# Patient Record
Sex: Male | Born: 1985 | Race: Black or African American | Hispanic: No | Marital: Single | Smoking: Current every day smoker
Health system: Southern US, Community
[De-identification: ages and names within clinical notes are randomized; demographics above are authoritative.]

---
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
## 2008-02-25 ENCOUNTER — Emergency Department (HOSPITAL_COMMUNITY): Admission: EM | Admit: 2008-02-25 | Discharge: 2008-02-26 | Payer: Self-pay | Admitting: Emergency Medicine

## 2008-10-28 ENCOUNTER — Emergency Department (HOSPITAL_COMMUNITY): Admission: EM | Admit: 2008-10-28 | Discharge: 2008-10-28 | Payer: Self-pay | Admitting: Emergency Medicine

## 2009-03-02 ENCOUNTER — Emergency Department (HOSPITAL_COMMUNITY): Admission: EM | Admit: 2009-03-02 | Discharge: 2009-03-03 | Payer: Self-pay | Admitting: Emergency Medicine

## 2011-03-12 ENCOUNTER — Emergency Department (HOSPITAL_COMMUNITY)
Admission: EM | Admit: 2011-03-12 | Discharge: 2011-03-13 | Disposition: A | Discharge status: Home / Self Care | Payer: Self-pay | Attending: Emergency Medicine | Admitting: Emergency Medicine

## 2011-03-12 DIAGNOSIS — M25539 Pain in unspecified wrist: Secondary | ICD-10-CM | POA: Insufficient documentation

## 2011-03-12 DIAGNOSIS — M25439 Effusion, unspecified wrist: Secondary | ICD-10-CM | POA: Insufficient documentation

## 2011-03-12 DIAGNOSIS — S6990XA Unspecified injury of unspecified wrist, hand and finger(s), initial encounter: Secondary | ICD-10-CM | POA: Insufficient documentation

## 2011-03-12 DIAGNOSIS — S62009A Unspecified fracture of navicular [scaphoid] bone of unspecified wrist, initial encounter for closed fracture: Secondary | ICD-10-CM | POA: Insufficient documentation

## 2011-03-12 DIAGNOSIS — M7989 Other specified soft tissue disorders: Secondary | ICD-10-CM | POA: Insufficient documentation

## 2011-03-12 DIAGNOSIS — M79609 Pain in unspecified limb: Secondary | ICD-10-CM | POA: Insufficient documentation

## 2011-03-13 ENCOUNTER — Emergency Department (HOSPITAL_COMMUNITY): Payer: Self-pay

## 2011-12-27 ENCOUNTER — Encounter (HOSPITAL_COMMUNITY): Payer: Self-pay | Admitting: *Deleted

## 2011-12-27 ENCOUNTER — Emergency Department (HOSPITAL_COMMUNITY)
Admission: EM | Admit: 2011-12-27 | Discharge: 2011-12-27 | Disposition: A | Discharge status: Home / Self Care | Payer: Self-pay | Attending: Emergency Medicine | Admitting: Emergency Medicine

## 2011-12-27 DIAGNOSIS — R21 Rash and other nonspecific skin eruption: Secondary | ICD-10-CM

## 2011-12-27 DIAGNOSIS — L299 Pruritus, unspecified: Secondary | ICD-10-CM | POA: Insufficient documentation

## 2011-12-27 MED ORDER — CEPHALEXIN 500 MG PO CAPS: 500.0000 mg | ORAL_CAPSULE | Freq: Four times a day (QID) | ORAL | Status: AC

## 2011-12-27 MED ORDER — PREDNISONE 10 MG PO TABS: 20.0000 mg | ORAL_TABLET | Freq: Every day | ORAL | Status: DC

## 2011-12-27 MED ORDER — DIPHENHYDRAMINE HCL 25 MG PO CAPS: 25.0000 mg | ORAL_CAPSULE | Freq: Four times a day (QID) | ORAL | Status: DC | PRN

## 2011-12-27 NOTE — ED Notes (Signed)
Pt states he started to have a rash about 4 days ago. Rash is on pt's back, arms, and face. Pt denies any sob with rash. Pt state rash itches and he is unsure of how he got the rash

## 2011-12-27 NOTE — ED Provider Notes (Signed)
History     CSN: [id number]  Arrival date & time 12/27/11  1503   First MD Initiated Contact with Patient 12/27/11 1625      Chief Complaint  Patient presents with  . Rash    (Consider location/radiation/quality/duration/timing/severity/associated sxs/prior treatment) HPI  26 year old male presents for evaluation of the rash. Patient states he started noticing a rash about 4 days ago. States he noticed the rash initially started on his forehead and now has spread throughout his body.  The rash is most noticeable to his face, arms, chest, and back. Rash is itchy, but nontender. Has not noticed any rash on his hands or on his feet.  Denies fever, headache, throat swelling, nausea, vomiting, or abnormal bleeding. Does report that he spend the night over his mom's house prior to having this rash denies anyone else at home with similar rash. Has had chickenpox in the past. Denies new detergent, new pets, or change in medication. Denies any recent travel.    History reviewed. No pertinent past medical history.  History reviewed. No pertinent past surgical history.  No family history on file.  History  Substance Use Topics  . Smoking status: Not on file  . Smokeless tobacco: Not on file  . Alcohol Use: Not on file      Review of Systems  All other systems reviewed and are negative.    Allergies  Review of patient's allergies indicates no known allergies.  Home Medications  No current outpatient prescriptions on file.  There were no vitals taken for this visit.  Physical Exam  Nursing note and vitals reviewed. Constitutional: He is oriented to person, place, and time. He appears well-developed and well-nourished. No distress.  HENT:  Head: Atraumatic.  Mouth/Throat: Oropharynx is clear and moist. No oropharyngeal exudate.  Eyes: Conjunctivae are normal. Right eye exhibits no discharge. Left eye exhibits no discharge. No scleral icterus.  Neck: Neck supple.    Pulmonary/Chest: Effort normal. No respiratory distress. He has no wheezes.  Abdominal: Soft.  Musculoskeletal: Normal range of motion. He exhibits no edema.  Neurological: He is alert and oriented to person, place, and time.  Skin: Skin is warm. Rash noted.       Maculopapular rash evenly distributed  on forehead, bilateral arms, abdomen, and back. Nonpustular, non-petechial, non-vesicular. No rash in palms of hands, or soles of feet.  No oral mucosal involvement.      ED Course  Procedures (including critical care time)  Labs Reviewed - No data to display No results found.   No diagnosis found.    MDM  Maculopapular itchy rash, without other acute finding.  Doubt scabies.  Doubt life threatening rash or allergic reaction.  Plan to d/c with prednisone, benadryl, and keflex to prevent secondary skin infection.  Pt instruct to cut fingernail short and avoid scratching.  Dermatology referral given.  Wife in the room, who denies having similar rash.         Fayrene Helper, PA-C 12/27/11 1641

## 2011-12-27 NOTE — ED Provider Notes (Signed)
Medical screening examination/treatment/procedure(s) were performed by non-physician practitioner and as supervising physician I was immediately available for consultation/collaboration.  Devoria Albe, MD, FACEP   Ward Givens, MD 12/27/11 [phone]

## 2012-01-01 ENCOUNTER — Emergency Department (HOSPITAL_COMMUNITY)
Admission: EM | Admit: 2012-01-01 | Discharge: 2012-01-01 | Disposition: A | Discharge status: Home / Self Care | Payer: Self-pay | Attending: Emergency Medicine | Admitting: Emergency Medicine

## 2012-01-01 ENCOUNTER — Encounter (HOSPITAL_COMMUNITY): Payer: Self-pay | Admitting: Emergency Medicine

## 2012-01-01 DIAGNOSIS — F17200 Nicotine dependence, unspecified, uncomplicated: Secondary | ICD-10-CM | POA: Insufficient documentation

## 2012-01-01 DIAGNOSIS — R21 Rash and other nonspecific skin eruption: Secondary | ICD-10-CM | POA: Insufficient documentation

## 2012-01-01 MED ORDER — PREDNISONE 20 MG PO TABS: 60.0000 mg | ORAL_TABLET | Freq: Every day | ORAL | Status: DC

## 2012-01-01 MED ORDER — PERMETHRIN 5 % EX CREA: TOPICAL_CREAM | CUTANEOUS | Status: AC

## 2012-01-01 MED ORDER — HYDROXYZINE HCL 25 MG PO TABS: 25.0000 mg | ORAL_TABLET | Freq: Four times a day (QID) | ORAL | Status: AC

## 2012-01-01 MED ORDER — DEXAMETHASONE SODIUM PHOSPHATE 10 MG/ML IJ SOLN
10.0000 mg | Freq: Once | INTRAMUSCULAR | Status: AC
  Administered 2012-01-01: 10 mg via INTRAVENOUS
  Filled 2012-01-01: qty 1

## 2012-01-01 MED ORDER — DIPHENHYDRAMINE HCL 50 MG/ML IJ SOLN
25.0000 mg | Freq: Once | INTRAMUSCULAR | Status: AC
  Administered 2012-01-01: 50 mg via INTRAMUSCULAR
  Filled 2012-01-01: qty 1

## 2012-01-01 MED ORDER — HYDROCORTISONE 2.5 % EX LOTN: TOPICAL_LOTION | Freq: Two times a day (BID) | CUTANEOUS | Status: DC

## 2012-01-01 NOTE — ED Provider Notes (Signed)
Medical screening examination/treatment/procedure(s) were conducted as a shared visit with non-physician practitioner(s) and myself.  I personally evaluated the patient during the encounter   Rolan Bucco, MD 01/01/12 1526

## 2012-01-01 NOTE — ED Notes (Signed)
Pt seen here for skin rash on 12/27/11 and is back due to rash becoming worse and spreading. Pt reports itching with no relief. Pt called dermotologist but "they told me they could not see me until January."

## 2012-01-01 NOTE — ED Provider Notes (Signed)
History     CSN: [id number]  Arrival date & time 01/01/12  1243   First MD Initiated Contact with Patient 01/01/12 1336      No chief complaint on file.   (Consider location/radiation/quality/duration/timing/severity/associated sxs/prior treatment) Patient is a 26 y.o. male presenting with rash. The history is provided by the patient and the spouse.  Rash  The current episode started more than 1 week ago. The problem has been gradually worsening. There has been no fever. Pertinent negatives include no pain and no weeping.   26 y/o male INAD c/o pruritic rash that started on 7/8 on the patients face. Pt was seen for similar on 7/10 and given keflex with prednisone 40mg  PO daily with no relief. Rash is spreading to arm then torsoand now to thighs and  Buttocks. Denies fever, similar sick contacts, or change in environmental exposures.   History reviewed. No pertinent past medical history.  History reviewed. No pertinent past surgical history.  No family history on file.  History  Substance Use Topics  . Smoking status: Current Everyday Smoker  . Smokeless tobacco: Not on file  . Alcohol Use: No      Review of Systems  Constitutional: Negative for fever.  Skin: Positive for rash.  All other systems reviewed and are negative.    Allergies  Review of patient's allergies indicates no known allergies.  Home Medications   Current Outpatient Rx  Name Route Sig Dispense Refill  . CEPHALEXIN 500 MG PO CAPS Oral Take 1 capsule (500 mg total) by mouth 4 (four) times daily. 28 capsule 0  . PREDNISONE 10 MG PO TABS Oral Take 2 tablets (20 mg total) by mouth daily. 15 tablet 0    BP 123/61  Pulse 59  Temp 98.3 F (36.8 C) (Oral)  Resp 18  SpO2 100%  Physical Exam  Vitals reviewed. Constitutional: He is oriented to person, place, and time. He appears well-developed and well-nourished. No distress.  HENT:  Head: Normocephalic.  Eyes: Conjunctivae and EOM are normal.    Cardiovascular: Normal rate.   Pulmonary/Chest: Effort normal.  Musculoskeletal: Normal range of motion.  Neurological: He is alert and oriented to person, place, and time.  Skin:       Maculopapular excoriated rash to face, arms, torso and upper thighs. No induration, warmth, weeping or pain. Web spaces spared.   Psychiatric: He has a normal mood and affect.    ED Course  Procedures (including critical care time)  Labs Reviewed - No data to display No results found.   No diagnosis found.  Rash  MDM  Puritic rash not consistent with fungal or bacterial infection, will elevate treatment with Decadron and benadryl injection. Will Rx with 60mg  prednisone taper and atarax. Will treat for scabies, though clinically unlikely. Will refer to Dr. Terri Piedra who generally has shorter wait times. Shared visit with Dr. Fredderick Phenix.  Pt verbalized understanding and agrees with care plan. Outpatient follow-up and return precautions given.          Joni Reining Daking Westervelt, PA-C 01/01/12 1440

## 2012-06-14 ENCOUNTER — Emergency Department (HOSPITAL_COMMUNITY)
Admission: EM | Admit: 2012-06-14 | Discharge: 2012-06-15 | Disposition: A | Discharge status: Home / Self Care | Payer: Self-pay | Attending: Emergency Medicine | Admitting: Emergency Medicine

## 2012-06-14 ENCOUNTER — Encounter (HOSPITAL_COMMUNITY): Payer: Self-pay

## 2012-06-14 ENCOUNTER — Emergency Department (HOSPITAL_COMMUNITY): Payer: Self-pay

## 2012-06-14 DIAGNOSIS — M25559 Pain in unspecified hip: Secondary | ICD-10-CM | POA: Insufficient documentation

## 2012-06-14 DIAGNOSIS — M549 Dorsalgia, unspecified: Secondary | ICD-10-CM | POA: Insufficient documentation

## 2012-06-14 DIAGNOSIS — S32009A Unspecified fracture of unspecified lumbar vertebra, initial encounter for closed fracture: Secondary | ICD-10-CM | POA: Insufficient documentation

## 2012-06-14 DIAGNOSIS — R109 Unspecified abdominal pain: Secondary | ICD-10-CM | POA: Insufficient documentation

## 2012-06-14 DIAGNOSIS — F17200 Nicotine dependence, unspecified, uncomplicated: Secondary | ICD-10-CM | POA: Insufficient documentation

## 2012-06-14 DIAGNOSIS — Y92410 Unspecified street and highway as the place of occurrence of the external cause: Secondary | ICD-10-CM | POA: Insufficient documentation

## 2012-06-14 DIAGNOSIS — Y9389 Activity, other specified: Secondary | ICD-10-CM | POA: Insufficient documentation

## 2012-06-14 LAB — URINALYSIS, ROUTINE W REFLEX MICROSCOPIC
Glucose, UA: NEGATIVE mg/dL
Hgb urine dipstick: NEGATIVE
Ketones, ur: NEGATIVE mg/dL
Leukocytes, UA: NEGATIVE
pH: 5.5 (ref 5.0–8.0)

## 2012-06-14 MED ORDER — METHOCARBAMOL 500 MG PO TABS: 1000.0000 mg | ORAL_TABLET | Freq: Four times a day (QID) | ORAL | Status: AC

## 2012-06-14 MED ORDER — OXYCODONE-ACETAMINOPHEN 5-325 MG PO TABS
1.0000 | ORAL_TABLET | Freq: Once | ORAL | Status: AC
  Administered 2012-06-14: 1 via ORAL
  Filled 2012-06-14: qty 1

## 2012-06-14 MED ORDER — OXYCODONE-ACETAMINOPHEN 5-325 MG PO TABS: 1.0000 | ORAL_TABLET | Freq: Four times a day (QID) | ORAL | Status: AC | PRN

## 2012-06-14 MED ORDER — HYDROCODONE-ACETAMINOPHEN 5-325 MG PO TABS
1.0000 | ORAL_TABLET | Freq: Once | ORAL | Status: AC
  Administered 2012-06-14: 1 via ORAL
  Filled 2012-06-14: qty 1

## 2012-06-14 MED ORDER — NAPROXEN 500 MG PO TABS: 500.0000 mg | ORAL_TABLET | Freq: Two times a day (BID) | ORAL | Status: AC
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
## 2012-06-14 NOTE — ED Notes (Signed)
Returned from CT.

## 2012-06-14 NOTE — ED Notes (Signed)
Patient transported to X-ray 
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
## 2012-06-15 NOTE — ED Provider Notes (Signed)
Medical screening examination/treatment/procedure(s) were conducted as a shared visit with non-physician practitioner(s) and myself.  I personally evaluated the patient during the encounter    Nelia Shi, MD 06/15/12 [phone]

## 2014-06-27 IMAGING — CT CT L SPINE W/O CM
2 series · 10 of 14 positions shown, 12 images · non-contrast
Comparison: Lumbar spine radiographs performed earlier today at

CLINICAL DATA: Evaluate fractures of the transverse processes of
the lumbar spine.

CT LUMBAR SPINE WITHOUT CONTRAST
TECHNIQUE: Multidetector CT imaging of the lumbar spine was
performed without intravenous contrast administration. Multiplanar
CT image reconstructions were also generated.

[Series 2: l-spine · axial · 0.28mm/px · z∈[-224,-56]mm · 7 of 113 slices shown, 9 images]
[im 15/113  soft-tissue]
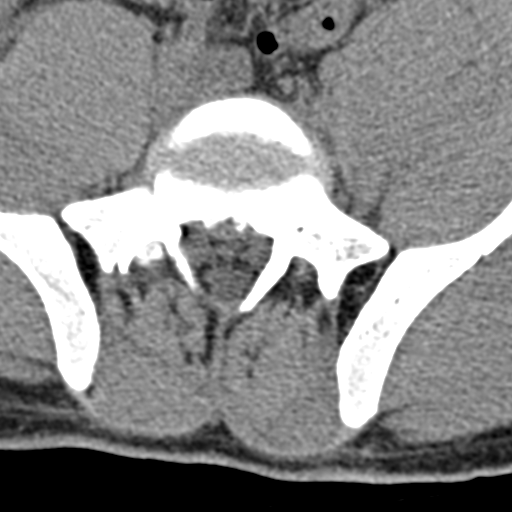
[im 15/113  bone]
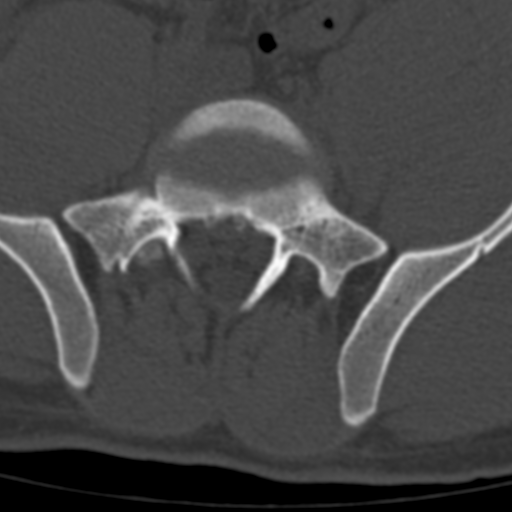
[im 29/113  bone]
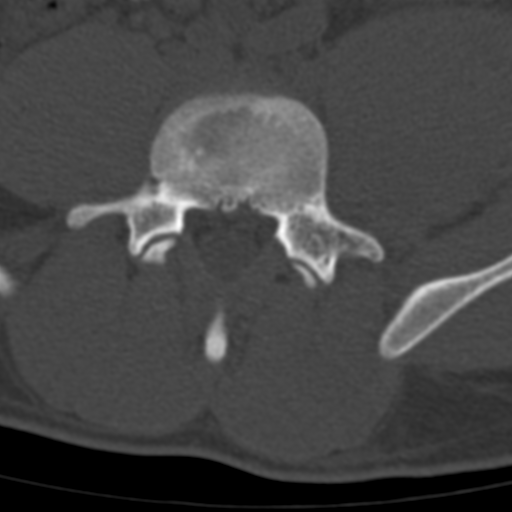
[im 43/113  bone]
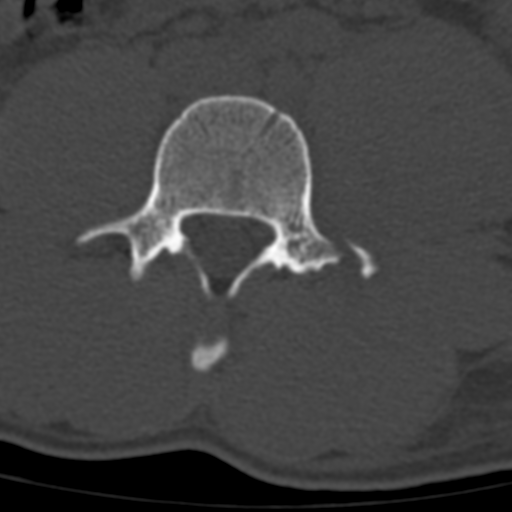
[im 57/113  bone]
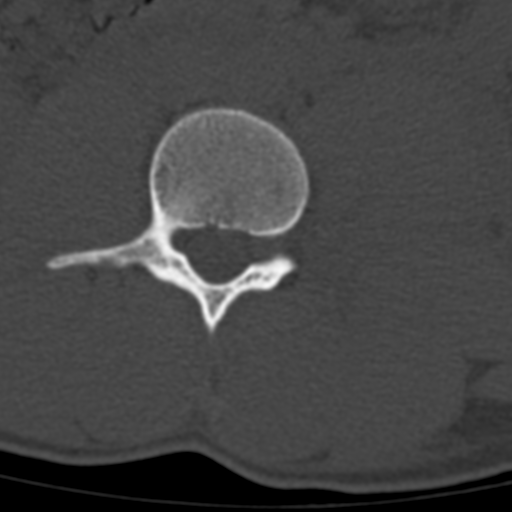
[im 71/113  soft-tissue]
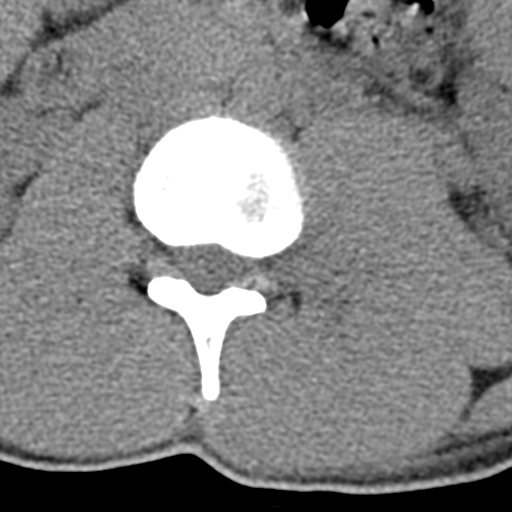
[im 71/113  bone]
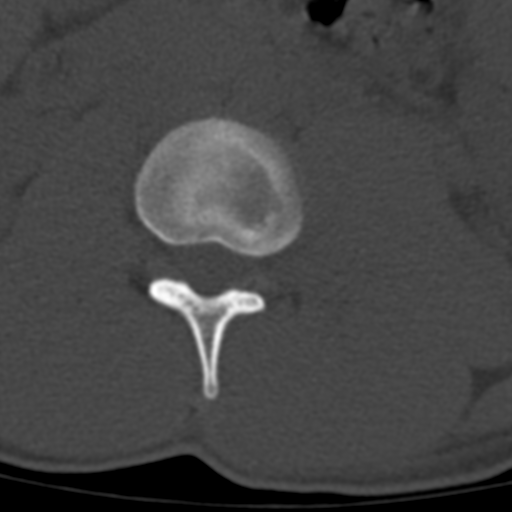
[im 85/113  bone]
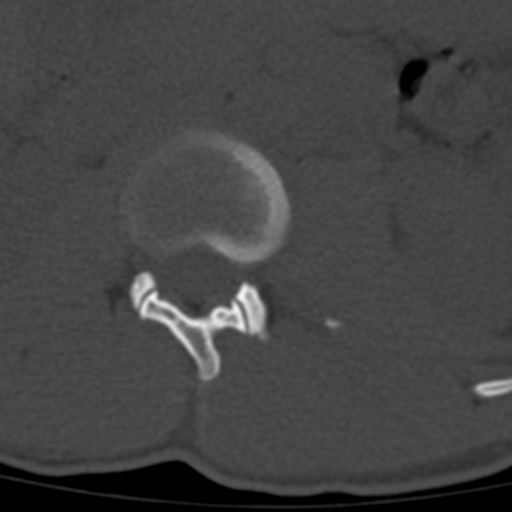
[im 99/113  bone]
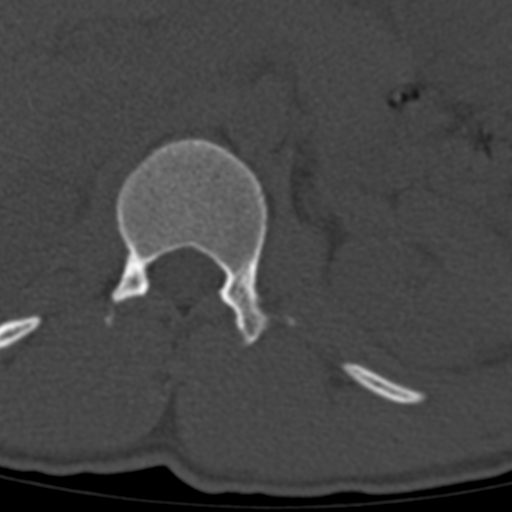

[Series 602: axial · axial · 0.32mm/px · z∈[-127,-58]mm · 3 of 71 slices shown]
[im 18/71  bone]
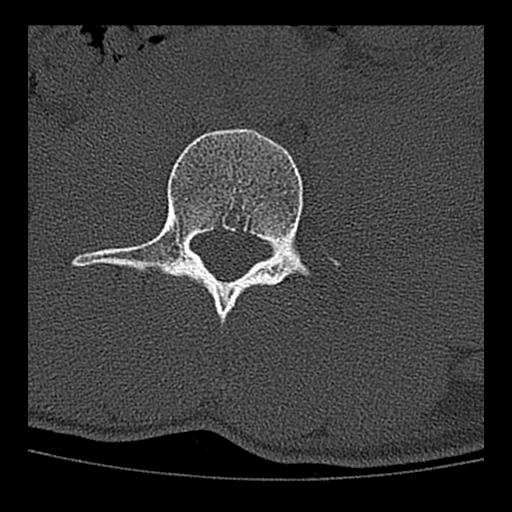
[im 36/71  bone]
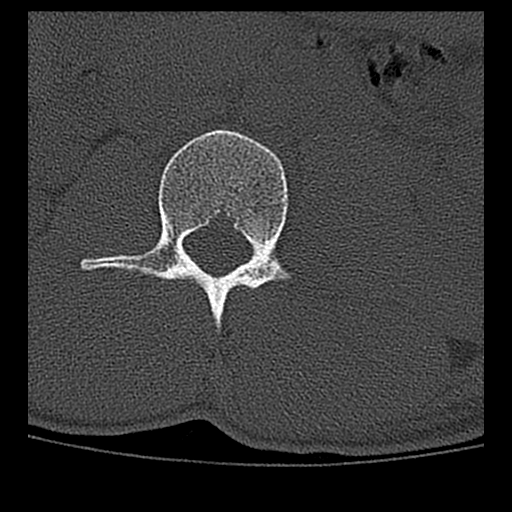
[im 53/71  bone]
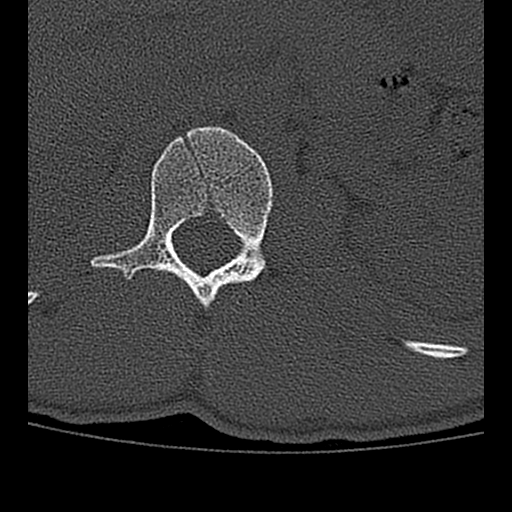

[10 of 14 positions shown; findings below may reference images not displayed]

FINDINGS: As noted on lumbar spine radiographs, there are displaced
fractures of the left transverse processes of L2, L3 and L4.  No
additional fractures are seen.  The vertebral bodies demonstrate
normal height and alignment.  Intervertebral disc spaces are
preserved.  The bony foramina are grossly unremarkable in
appearance.

No significant disc protrusions are characterized on CT.  Minimal
multilevel disc bulging remains at the upper limits of normal.

There is suggestion of a small amount of likely retroperitoneal
blood tracking anterior to the left psoas muscle; suggest clinical
correlation for hemodynamic stability.
IMPRESSION: 1.  Displaced fractures of the left transverse processes of L2, L3
and L4.  No additional fractures seen.
2.  Suggestion of a small amount of likely retroperitoneal blood
tracking anterior to the left psoas muscle; suggest clinical
correlation for hemodynamic stability.

to Tarantini D Alessandro, who verbally acknowledged these results.

## 2015-03-29 ENCOUNTER — Encounter (HOSPITAL_COMMUNITY): Payer: Self-pay

## 2015-03-29 ENCOUNTER — Emergency Department (HOSPITAL_COMMUNITY)
Admission: EM | Admit: 2015-03-29 | Discharge: 2015-03-29 | Disposition: A | Discharge status: Home / Self Care | Payer: Self-pay | Attending: Emergency Medicine | Admitting: Emergency Medicine

## 2015-03-29 DIAGNOSIS — S01111A Laceration without foreign body of right eyelid and periocular area, initial encounter: Secondary | ICD-10-CM | POA: Insufficient documentation

## 2015-03-29 DIAGNOSIS — Y92149 Unspecified place in prison as the place of occurrence of the external cause: Secondary | ICD-10-CM | POA: Insufficient documentation

## 2015-03-29 DIAGNOSIS — Z23 Encounter for immunization: Secondary | ICD-10-CM | POA: Insufficient documentation

## 2015-03-29 DIAGNOSIS — W2209XA Striking against other stationary object, initial encounter: Secondary | ICD-10-CM | POA: Insufficient documentation

## 2015-03-29 DIAGNOSIS — Z720 Tobacco use: Secondary | ICD-10-CM | POA: Insufficient documentation

## 2015-03-29 DIAGNOSIS — Y998 Other external cause status: Secondary | ICD-10-CM | POA: Insufficient documentation

## 2015-03-29 DIAGNOSIS — Z791 Long term (current) use of non-steroidal anti-inflammatories (NSAID): Secondary | ICD-10-CM | POA: Insufficient documentation

## 2015-03-29 DIAGNOSIS — Y9389 Activity, other specified: Secondary | ICD-10-CM | POA: Insufficient documentation

## 2015-03-29 DIAGNOSIS — Z79899 Other long term (current) drug therapy: Secondary | ICD-10-CM | POA: Insufficient documentation

## 2015-03-29 DIAGNOSIS — S0191XA Laceration without foreign body of unspecified part of head, initial encounter: Secondary | ICD-10-CM

## 2015-03-29 MED ORDER — TETANUS-DIPHTH-ACELL PERTUSSIS 5-2.5-18.5 LF-MCG/0.5 IM SUSP
0.5000 mL | Freq: Once | INTRAMUSCULAR | Status: AC
  Administered 2015-03-29: 0.5 mL via INTRAMUSCULAR
  Filled 2015-03-29: qty 0.5

## 2015-03-29 MED ORDER — LIDOCAINE HCL (PF) 1 % IJ SOLN
5.0000 mL | Freq: Once | INTRAMUSCULAR | Status: AC
  Administered 2015-03-29: 5 mL
  Filled 2015-03-29: qty 5

## 2015-03-29 NOTE — Discharge Instructions (Signed)
1. Medications: usual home medications 2. Treatment: rest, drink plenty of fluids, keep dressing clean and dry, change dressing daily 3. Follow Up: please followup in 7 days for suture removal; please return to the ER for severe pain, persistent bleeding, signs of infection (redness, heat, discharge), new or worsening symptoms   Head Injury, Adult You have a head injury. Headaches and throwing up (vomiting) are common after a head injury. It should be easy to wake up from sleeping. Sometimes you must stay in the hospital. Most problems happen within the first 24 hours. Side effects may occur up to 7-10 days after the injury.  WHAT ARE THE TYPES OF HEAD INJURIES? Head injuries can be as minor as a bump. Some head injuries can be more severe. More severe head injuries include:  A jarring injury to the brain (concussion).  A bruise of the brain (contusion). This mean there is bleeding in the brain that can cause swelling.  A cracked skull (skull fracture).  Bleeding in the brain that collects, clots, and forms a bump (hematoma). WHEN SHOULD I GET HELP RIGHT AWAY?   You are confused or sleepy.  You cannot be woken up.  You feel sick to your stomach (nauseous) or keep throwing up (vomiting).  Your dizziness or unsteadiness is getting worse.  You have very bad, lasting headaches that are not helped by medicine. Take medicines only as told by your doctor.  You cannot use your arms or legs like normal.  You cannot walk.  You notice changes in the black spots in the center of the colored part of your eye (pupil).  You have clear or bloody fluid coming from your nose or ears.  You have trouble seeing. During the next 24 hours after the injury, you must stay with someone who can watch you. This person should get help right away (call 911 in the [place] if you start to shake and are not able to control it (have seizures), you pass out, or you are unable to wake up. HOW CAN I PREVENT A HEAD  INJURY IN THE FUTURE?  Wear seat belts.  Wear a helmet while bike riding and playing sports like football.  Stay away from dangerous activities around the house. WHEN CAN I RETURN TO NORMAL ACTIVITIES AND ATHLETICS? See your doctor before doing these activities. You should not do normal activities or play contact sports until 1 week after the following symptoms have stopped:  Headache that does not go away.  Dizziness.  Poor attention.  Confusion.  Memory problems.  Sickness to your stomach or throwing up.  Tiredness.  Fussiness.  Bothered by bright lights or loud noises.  Anxiousness or depression.  Restless sleep. MAKE SURE YOU:   Understand these instructions.  Will watch your condition.  Will get help right away if you are not doing well or get worse.   This information is not intended to replace advice given to you by your health care provider. Make sure you discuss any questions you have with your health care provider.   Document Released: 05/18/2008 Document Revised: 06/26/2014 Document Reviewed: 02/10/2013 Elsevier Interactive Patient Education 2016 [employer]  Sutured Wound Care Sutures are stitches that can be used to close wounds. Taking care of your wound properly can help to prevent pain and infection. It can also help your wound to heal more quickly. HOW TO CARE FOR YOUR SUTURED WOUND Wound Care  Keep the wound clean and dry.  If you were given a bandage (  dressing), you should change it at least once per day or as directed by your health care provider. You should also change it if it becomes wet or dirty.  Keep the wound completely dry for the first 24 hours or as directed by your health care provider. After that time, you may shower or bathe. However, make sure that the wound is not soaked in water until the sutures have been removed.  Clean the wound one time each day or as directed by your health care provider.  Wash the wound with soap  and water.  Rinse the wound with water to remove all soap.  Pat the wound dry with a clean towel. Do not rub the wound.  Aftercleaning the wound, apply a thin layer of antibioticointment as directed by your health care provider. This will help to prevent infection and keep the dressing from sticking to the wound.  Have the sutures removed as directed by your health care provider. General Instructions  Take or apply medicines only as directed by your health care provider.  To help prevent scarring, make sure to cover your wound with sunscreen whenever you are outside after the sutures are removed and the wound is healed. Make sure to wear a sunscreen of at least 30 SPF.  If you were prescribed an antibiotic medicine or ointment, finish all of it even if you start to feel better.  Do not scratch or pick at the wound.  Keep all follow-up visits as directed by your health care provider. This is important.  Check your wound every day for signs of infection. Watch for:   Redness, swelling, or pain.  Fluid, blood, or pus.  Raise (elevate) the injured area above the level of your heart while you are sitting or lying down, if possible.  Avoid stretching your wound.  Drink enough fluids to keep your urine clear or pale yellow. SEEK MEDICAL CARE IF:  You received a tetanus shot and you have swelling, severe pain, redness, or bleeding at the injection site.  You have a fever.  A wound that was closed breaks open.  You notice a bad smell coming from the wound.  You notice something coming out of the wound, such as wood or glass.  Your pain is not controlled with medicine.  You have increased redness, swelling, or pain at the site of your wound.  You have fluid, blood, or pus coming from your wound.  You notice a change in the color of your skin near your wound.  You need to change the dressing frequently due to fluid, blood, or pus draining from the wound.  You develop a  new rash.  You develop numbness around the wound. SEEK IMMEDIATE MEDICAL CARE IF:  You develop severe swelling around the injury site.  Your pain suddenly increases and is severe.  You develop painful lumps near the wound or on skin that is anywhere on your body.  You have a red streak going away from your wound.  The wound is on your hand or foot and you cannot properly move a finger or toe.  The wound is on your hand or foot and you notice that your fingers or toes look pale or bluish.   This information is not intended to replace advice given to you by your health care provider. Make sure you discuss any questions you have with your health care provider.   Document Released: 07/13/2004 Document Revised: 10/20/2014 Document Reviewed: 01/15/2013 Elsevier Interactive Patient Education 2016  [employer].

## 2015-03-29 NOTE — ED Notes (Signed)
Pt hit his head on a table in jail and has about a 2 inch laceration above his right eye, bleeding controlled, he also has an abrasion and hematoma on the left side of his forehead

## 2015-03-29 NOTE — ED Provider Notes (Signed)
CSN: [id number]     Arrival date & time 03/29/15  1922 History  By signing my name below, I, Brett Stevenson, attest that this documentation has been prepared under the direction and in the presence of Glean Hess, [place]. Electronically Signed: Ronney Stevenson, ED Scribe. 03/29/2015. 7:53 PM.    Chief Complaint  Patient presents with  . Head Laceration   The history is provided by the patient. No language interpreter was used.     HPI Comments: Brett Stevenson is a 29 y.o. male who [facility] the Emergency Department [facility] with laceration above his right eye s/p slipping and striking his face on the corner of a table. He denies LOC. He denies additional injury. He denies headache, lightheadedness, dizziness, vision changes, chest pain, SOB, abdominal pain, nausea, vomiting, numbness, paresthesia, weakness, neck pain, back pain, extremity pain. Patient is unsure is his tetanus is up to date.   History reviewed. No pertinent past medical history. History reviewed. No pertinent past surgical history. History reviewed. No pertinent family history. Social History  Substance Use Topics  . Smoking status: Current Every Day Smoker -- 1.00 packs/day    Types: Cigarettes  . Smokeless tobacco: None  . Alcohol Use: Yes     Comment: occasionally      Review of Systems  Eyes: Negative for visual disturbance.  Respiratory: Negative for shortness of breath.   Cardiovascular: Negative for chest pain.  Gastrointestinal: Negative for nausea, vomiting and abdominal pain.  Musculoskeletal: Negative for back pain and neck pain.  Skin: Positive for wound.  Neurological: Negative for dizziness, weakness, light-headedness and headaches.   Allergies  Review of patient's allergies indicates no known allergies.  Home Medications   Prior to Admission medications   Medication Sig Start Date End Date Taking? Authorizing Provider  albuterol (PROVENTIL HFA;VENTOLIN HFA) 108 (90 BASE) MCG/ACT  inhaler Inhale 2 puffs into the lungs every 6 (six) hours as needed. Shortness of breath    Historical Provider, MD  methocarbamol (ROBAXIN) 500 MG tablet Take 2 tablets (1,000 mg total) by mouth 4 (four) times daily. 06/14/12   Renne Crigler, PA-C  naproxen (NAPROSYN) 500 MG tablet Take 1 tablet (500 mg total) by mouth 2 (two) times daily. 06/14/12   Renne Crigler, PA-C  oxyCODONE-acetaminophen (PERCOCET/ROXICET) 5-325 MG per tablet Take 1-2 tablets by mouth every 6 (six) hours as needed for pain. 06/14/12   Renne Crigler, PA-C    BP 143/74 mmHg  Pulse 60  Temp(Src) 97.8 F (36.6 C) (Oral)  Resp 20  SpO2 100% Physical Exam  Constitutional: He is oriented to person, place, and time. He appears well-developed and well-nourished. No distress.  HENT:  Head: Normocephalic. Head is with contusion and with laceration.    Right Ear: Tympanic membrane, external ear and ear canal normal.  Left Ear: Tympanic membrane, external ear and ear canal normal.  Nose: Nose normal. Right sinus exhibits no maxillary sinus tenderness and no frontal sinus tenderness. Left sinus exhibits no maxillary sinus tenderness and no frontal sinus tenderness.  Mouth/Throat: Oropharynx is clear and moist.  Eyes: Conjunctivae and EOM are normal. Pupils are equal, round, and reactive to light. Right eye exhibits no discharge. Left eye exhibits no discharge. No scleral icterus.  Neck: Normal range of motion. Neck supple.  Cardiovascular: Normal rate, regular rhythm and intact distal pulses.   Pulmonary/Chest: Effort normal and breath sounds normal. No respiratory distress.  Musculoskeletal: Normal range of motion. He exhibits no edema or tenderness.  Neurological: He is alert and oriented to person, place, and time. He has normal strength. No sensory deficit.  Skin: Skin is warm and dry. He is not diaphoretic.  Psychiatric: He has a normal mood and affect. His behavior is normal.  Nursing note and vitals reviewed.   ED  Course  .Marland KitchenLaceration Repair Date/Time: 03/29/2015 11:04 PM Performed by: Glean Hess C Authorized by: Glean Hess C Consent: Verbal consent obtained. Risks and benefits: risks, benefits and alternatives were discussed Consent given by: patient Patient understanding: patient states understanding of the procedure being performed Patient consent: the patient's understanding of the procedure matches consent given Procedure consent: procedure consent matches procedure scheduled Relevant documents: relevant documents present and verified Site marked: the operative site was marked Required items: required blood products, implants, devices, and special equipment available Patient identity confirmed: verbally with patient Time out: Immediately prior to procedure a "time out" was called to verify the correct patient, procedure, equipment, support staff and site/side marked as required. Body area: head/neck Location details: right eyelid Laceration length: 1.5 cm Foreign bodies: no foreign bodies Tendon involvement: none Nerve involvement: none Vascular damage: no Anesthesia: local infiltration Local anesthetic: lidocaine 1% without epinephrine Anesthetic total: 4 ml Patient sedated: no Preparation: Patient was prepped and draped in the usual sterile fashion. Irrigation solution: saline Irrigation method: tap Amount of cleaning: standard Debridement: none Degree of undermining: none Skin closure: 6-0 Prolene Number of sutures: 2 Technique: simple Approximation: close Approximation difficulty: simple Dressing: 4x4 sterile gauze Patient tolerance: Patient tolerated the procedure well with no immediate complications   DIAGNOSTIC STUDIES: Oxygen Saturation is 100% on RA, normal by my interpretation.    COORDINATION OF CARE: 7:53 PM - Discussed treatment plan with pt at bedside which includes updating tetanus and suture repair. Do not believe CT head is warranted at this  time. Will administer Tylenol here for patient's pain. Pt verbalized understanding and agreed to plan.    MDM   Final diagnoses:  Laceration of head, initial encounter    29 year old male presents with laceration superior to his right eyelid after slipping and hitting his head on a table. Denies LOC, headache, lightheadedness, dizziness, vision changes, additional injury.  Patient is afebrile. Vital signs stable. 1.5 cm laceration superior to right eyelid, hemostatic. Small hematoma to left forehead with overlying superficial abrasion. Normal neuro exam with no focal deficit.  Laceration cleaned, repaired, and dressed in the ED, which the patient tolerated well. Tetanus updated. Patient to receive dressing changes in jail. Advised patient to have sutures removed in 7 days. Return precautions discussed.  I personally performed the services described in this documentation, which was scribed in my presence. The recorded information has been reviewed and is accurate.  BP 143/74 mmHg  Pulse 60  Temp(Src) 97.8 F (36.6 C) (Oral)  Resp 20  SpO2 100%       Mady Gemma, PA-C 03/29/15 2310  Arby Barrette, MD 04/01/15 1411

## 2015-11-08 ENCOUNTER — Emergency Department (HOSPITAL_COMMUNITY)
Admission: EM | Admit: 2015-11-08 | Discharge: 2015-11-08 | Discharge status: Against Medical Advice | Payer: No Typology Code available for payment source

## 2015-11-08 NOTE — ED Notes (Signed)
Called patient to be triaged but no answer.

## 2015-11-08 NOTE — ED Notes (Signed)
Called patient to be triaged but no answer.  

## 2018-03-23 ENCOUNTER — Encounter (HOSPITAL_COMMUNITY): Payer: Self-pay | Admitting: Emergency Medicine

## 2018-03-23 ENCOUNTER — Ambulatory Visit (HOSPITAL_COMMUNITY)
Admission: EM | Admit: 2018-03-23 | Discharge: 2018-03-23 | Disposition: A | Discharge status: Home / Self Care | Payer: Medicaid Other | Attending: Emergency Medicine | Admitting: Emergency Medicine

## 2018-03-23 DIAGNOSIS — A549 Gonococcal infection, unspecified: Secondary | ICD-10-CM

## 2018-03-23 DIAGNOSIS — Z113 Encounter for screening for infections with a predominantly sexual mode of transmission: Secondary | ICD-10-CM | POA: Diagnosis not present

## 2018-03-23 DIAGNOSIS — F17210 Nicotine dependence, cigarettes, uncomplicated: Secondary | ICD-10-CM | POA: Insufficient documentation

## 2018-03-23 DIAGNOSIS — R300 Dysuria: Secondary | ICD-10-CM | POA: Diagnosis not present

## 2018-03-23 DIAGNOSIS — Z711 Person with feared health complaint in whom no diagnosis is made: Secondary | ICD-10-CM

## 2018-03-23 DIAGNOSIS — R369 Urethral discharge, unspecified: Secondary | ICD-10-CM | POA: Diagnosis not present

## 2018-03-23 DIAGNOSIS — Z79899 Other long term (current) drug therapy: Secondary | ICD-10-CM | POA: Diagnosis not present

## 2018-03-23 DIAGNOSIS — Z202 Contact with and (suspected) exposure to infections with a predominantly sexual mode of transmission: Secondary | ICD-10-CM | POA: Insufficient documentation

## 2018-03-23 LAB — POCT URINALYSIS DIP (DEVICE)
Bilirubin Urine: NEGATIVE
Glucose, UA: NEGATIVE mg/dL
HGB URINE DIPSTICK: NEGATIVE
Ketones, ur: NEGATIVE mg/dL
NITRITE: NEGATIVE
PH: 5.5 (ref 5.0–8.0)
Protein, ur: 30 mg/dL — AB
Specific Gravity, Urine: >=1.03 (ref 1.005–1.030)
UROBILINOGEN UA: 2 mg/dL — AB (ref 0.0–1.0)

## 2018-03-23 MED ORDER — AZITHROMYCIN 250 MG PO TABS
ORAL_TABLET | ORAL | Status: AC
  Filled 2018-03-23: qty 4

## 2018-03-23 MED ORDER — CEFTRIAXONE SODIUM 250 MG IJ SOLR
250.0000 mg | Freq: Once | INTRAMUSCULAR | Status: AC
  Administered 2018-03-23: 250 mg via INTRAMUSCULAR

## 2018-03-23 MED ORDER — AZITHROMYCIN 250 MG PO TABS
1000.0000 mg | ORAL_TABLET | Freq: Once | ORAL | Status: AC
  Administered 2018-03-23: 1000 mg via ORAL

## 2018-03-23 MED ORDER — LIDOCAINE HCL (PF) 1 % IJ SOLN
INTRAMUSCULAR | Status: AC
  Filled 2018-03-23: qty 2

## 2018-03-23 MED ORDER — CEFTRIAXONE SODIUM 250 MG IJ SOLR
INTRAMUSCULAR | Status: AC
  Filled 2018-03-23: qty 250

## 2018-03-23 NOTE — Discharge Instructions (Addendum)
We will contact you if any of your tests are positive.  °

## 2018-03-23 NOTE — ED Triage Notes (Signed)
Pt here for STD screening. 

## 2018-03-23 NOTE — ED Provider Notes (Signed)
[facility]    CSN: [id number] Arrival date & time: 03/23/18  1321     History   Chief Complaint Chief Complaint  Patient presents with  . Exposure to STD    HPI Brett Stevenson is a 32 y.o. male.   HPI   The patient reports discomfort and penile drainage onset last night.  States discharge has pus-like appearance. Admits does not use condoms with sexual partners.  Reports discomfort with urination. The patient denies any generalized malaise, fever, chills, eye problems, sore throat, skin rash or joint pain.  Denies abdominal pain, nausea, or vomiting.  The patient reports dysuria, burning, but denies testicular pain or swelling, lesions of the penis.  History reviewed. No pertinent past medical history.  There are no active problems to display for this patient.   History reviewed. No pertinent surgical history.    Home Medications    Prior to Admission medications   Medication Sig Start Date End Date Taking? Authorizing Provider  albuterol (PROVENTIL HFA;VENTOLIN HFA) 108 (90 BASE) MCG/ACT inhaler Inhale 2 puffs into the lungs every 6 (six) hours as needed. Shortness of breath    [provider]  methocarbamol (ROBAXIN) 500 MG tablet Take 2 tablets (1,000 mg total) by mouth 4 (four) times daily. Patient not taking: Reported on 03/23/2018 06/14/12   Renne Crigler, PA-C  naproxen (NAPROSYN) 500 MG tablet Take 1 tablet (500 mg total) by mouth 2 (two) times daily. 06/14/12   Renne Crigler, PA-C  oxyCODONE-acetaminophen (PERCOCET/ROXICET) 5-325 MG per tablet Take 1-2 tablets by mouth every 6 (six) hours as needed for pain. Patient not taking: Reported on 03/23/2018 06/14/12   Renne Crigler, PA-C    Family History History reviewed. No pertinent family history.  Social History Social History   Tobacco Use  . Smoking status: Current Every Day Smoker    Packs/day: 1.00    Types: Cigarettes  Substance Use Topics  . Alcohol use: Yes    Comment:  occasionally  . Drug use: Yes    Types: Marijuana     Allergies   Patient has no known allergies.   Review of Systems Review of Systems   Physical Exam Triage Vital Signs ED Triage Vitals [03/23/18 1355]  Enc Vitals Group     BP (!) 170/84     Pulse Rate 93     Resp 18     Temp 98.4 F (36.9 C)     Temp Source Oral     SpO2 100 %     Weight      Height      Head Circumference      Peak Flow      Pain Score      Pain Loc      Pain Edu?      Excl. in GC?    No data found.  Updated Vital Signs BP (!) 170/84 (BP Location: Left Arm)   Pulse 93   Temp 98.4 F (36.9 C) (Oral)   Resp 18   SpO2 100%   Visual Acuity Right Eye Distance:   Left Eye Distance:   Bilateral Distance:    Right Eye Near:   Left Eye Near:    Bilateral Near:     Physical Exam  Constitutional: He appears well-developed and well-nourished. No distress.  Cardiovascular: Normal rate, regular rhythm, normal heart sounds and intact distal pulses. Exam reveals no gallop and no friction rub.  No murmur heard. Pulmonary/Chest: Effort normal and breath sounds normal.  No stridor. No respiratory distress. He has no wheezes. He has no rales. He exhibits no tenderness.  Abdominal: Soft. Bowel sounds are normal.  Genitourinary: Testes normal and penis normal. Cremasteric reflex is present. Circumcised. No penile tenderness.  Genitourinary Comments: Reports discomfort and irritation at penile urethra.  Some greenish discharge expressed.   Skin: Skin is warm and dry. He is not diaphoretic.  Nursing note and vitals reviewed.    UC Treatments / Results  Labs (all labs ordered are listed, but only abnormal results are displayed) Labs Reviewed  RPR  HIV ANTIBODY (ROUTINE TESTING W REFLEX)  URINE CYTOLOGY ANCILLARY ONLY    EKG None  Radiology No results found.  Procedures Procedures (including critical care time)  Medications Ordered in UC Medications  cefTRIAXone (ROCEPHIN) injection 250  mg (250 mg Intramuscular Given 03/23/18 1407)  azithromycin (ZITHROMAX) tablet 1,000 mg (1,000 mg Oral Given 03/23/18 1407)    Initial Impression / Assessment and Plan / UC Course  I have reviewed the triage vital signs and the nursing notes.  Pertinent labs & imaging results that were available during my care of the patient were reviewed by me and considered in my medical decision making (see chart for details).     Safe sex practices discussed.  The patient requests testing for "all of it".  Advised he needed to inform his sexual partners to get tested and no intercourse for 10 days.   Final Clinical Impressions(s) / UC Diagnoses   Final diagnoses:  Penile discharge  Concern about STD in male without diagnosis   Discharge Instructions   None    ED Prescriptions    None     Controlled Substance Prescriptions [place] Controlled Substance Registry consulted? No   The usual and customary discharge instructions and warnings were given.  The patient verbalizes understanding and agrees to plan of care.      Servando Salina, NP 03/23/18 1420

## 2018-03-24 LAB — RPR: RPR: NONREACTIVE

## 2018-03-24 LAB — HIV ANTIBODY (ROUTINE TESTING W REFLEX): HIV SCREEN 4TH GENERATION: NONREACTIVE

## 2018-03-25 ENCOUNTER — Telehealth (HOSPITAL_COMMUNITY): Payer: Self-pay

## 2018-03-25 LAB — URINE CYTOLOGY ANCILLARY ONLY
Chlamydia: NEGATIVE
Neisseria Gonorrhea: POSITIVE — AB

## 2018-03-25 NOTE — Telephone Encounter (Signed)
Test for gonorrhea was positive. This was treated at the urgent care visit with IM rocephin 250mg and po zithromax 1g.Need to educate patient to refrain from sexual intercourse for 7 days after treatment to give the medicine time to work. Sexual partners need to be notified and tested/treated. Condoms may reduce risk of reinfection. Recheck or followup with PCP for further evaluation if symptoms are not improving.  [facility] notified. Attempted to reach patient. No answer at this time. 

## 2018-03-28 ENCOUNTER — Telehealth (HOSPITAL_COMMUNITY): Payer: Self-pay

## 2018-03-28 NOTE — Telephone Encounter (Signed)
Attempted to reach patient. Not available at this time. Encouraged family member to have him call me back at his earliest convenience.

## 2018-03-28 NOTE — Telephone Encounter (Signed)
Pt aware of results and educated on safe sex practices.
